# Patient Record
Sex: Female | Born: 1955 | Race: White | Hispanic: No | Smoking: Current every day smoker
Health system: Southern US, Community
[De-identification: ages and names within clinical notes are randomized; demographics above are authoritative.]

## PROBLEM LIST (undated history)

## (undated) DIAGNOSIS — I8390 Asymptomatic varicose veins of unspecified lower extremity: Secondary | ICD-10-CM

## (undated) DIAGNOSIS — E669 Obesity, unspecified: Secondary | ICD-10-CM

## (undated) DIAGNOSIS — M1990 Unspecified osteoarthritis, unspecified site: Secondary | ICD-10-CM

## (undated) DIAGNOSIS — Z1211 Encounter for screening for malignant neoplasm of colon: Secondary | ICD-10-CM

## (undated) DIAGNOSIS — Z87891 Personal history of nicotine dependence: Secondary | ICD-10-CM

## (undated) DIAGNOSIS — I83893 Varicose veins of bilateral lower extremities with other complications: Secondary | ICD-10-CM

## (undated) HISTORY — DX: Obesity, unspecified: E66.9

## (undated) HISTORY — DX: Personal history of nicotine dependence: Z87.891

## (undated) HISTORY — DX: Asymptomatic varicose veins of unspecified lower extremity: I83.90

## (undated) HISTORY — DX: Unspecified osteoarthritis, unspecified site: M19.90

## (undated) HISTORY — PX: OTHER SURGICAL HISTORY: SHX169

## (undated) HISTORY — DX: Encounter for screening for malignant neoplasm of colon: Z12.11

## (undated) HISTORY — DX: Varicose veins of bilateral lower extremities with other complications: I83.893

---

## 1991-02-25 HISTORY — PX: TUBAL LIGATION: SHX77

## 2006-02-24 HISTORY — PX: COLONOSCOPY: SHX174

## 2006-03-02 ENCOUNTER — Ambulatory Visit: Payer: Self-pay | Admitting: Gastroenterology

## 2006-08-04 ENCOUNTER — Ambulatory Visit: Payer: Self-pay | Admitting: General Surgery

## 2010-03-26 ENCOUNTER — Ambulatory Visit: Payer: Self-pay | Admitting: Family Medicine

## 2011-09-25 ENCOUNTER — Ambulatory Visit: Payer: Self-pay

## 2011-11-29 ENCOUNTER — Ambulatory Visit: Payer: Self-pay | Admitting: Internal Medicine

## 2012-01-29 ENCOUNTER — Ambulatory Visit: Payer: Self-pay

## 2012-03-18 ENCOUNTER — Ambulatory Visit: Payer: Self-pay

## 2012-08-11 ENCOUNTER — Encounter: Payer: Self-pay | Admitting: *Deleted

## 2012-12-29 ENCOUNTER — Telehealth: Payer: Self-pay

## 2012-12-29 NOTE — Telephone Encounter (Signed)
Patient will call us for any future problems.

## 2012-12-29 NOTE — Telephone Encounter (Signed)
Message copied by Sinda Du on Wed Dec 29, 2012  3:55 PM ------      Message from: Kieth Brightly      Created: Wed Dec 29, 2012  2:12 PM      Regarding: RE: recalls       Make sure this documented somewhere. It is ok fro pt to return as needed.      ----- Message -----         From: Sinda Du, LPN         Sent: 12/29/2012  11:10 AM           To: Kieth Brightly, MD      Subject: recalls                                                  Patient called to cancel her 1 year follow up for varicose veins. She said that she was doing very well and that she will call back if any problems arise. She did not feel the need to follow up this year. She reports no bulging veins or leg discomfort.       ------

## 2013-01-03 ENCOUNTER — Ambulatory Visit: Payer: Self-pay | Admitting: General Surgery

## 2013-06-28 ENCOUNTER — Ambulatory Visit: Payer: Self-pay

## 2014-01-18 ENCOUNTER — Ambulatory Visit: Payer: Self-pay | Admitting: General Surgery

## 2014-08-07 DIAGNOSIS — M8580 Other specified disorders of bone density and structure, unspecified site: Secondary | ICD-10-CM | POA: Insufficient documentation

## 2014-08-07 DIAGNOSIS — F411 Generalized anxiety disorder: Secondary | ICD-10-CM | POA: Insufficient documentation

## 2014-08-08 ENCOUNTER — Encounter: Payer: Self-pay | Admitting: Unknown Physician Specialty

## 2014-08-08 ENCOUNTER — Other Ambulatory Visit: Payer: Self-pay | Admitting: Unknown Physician Specialty

## 2014-08-08 ENCOUNTER — Ambulatory Visit (INDEPENDENT_AMBULATORY_CARE_PROVIDER_SITE_OTHER): Payer: BLUE CROSS/BLUE SHIELD | Admitting: Unknown Physician Specialty

## 2014-08-08 VITALS — BP 117/80 | HR 79 | Temp 98.2°F | Ht 63.7 in | Wt 170.6 lb

## 2014-08-08 DIAGNOSIS — M8580 Other specified disorders of bone density and structure, unspecified site: Secondary | ICD-10-CM | POA: Diagnosis not present

## 2014-08-08 DIAGNOSIS — Z0000 Encounter for general adult medical examination without abnormal findings: Secondary | ICD-10-CM | POA: Diagnosis not present

## 2014-08-08 DIAGNOSIS — Z23 Encounter for immunization: Secondary | ICD-10-CM | POA: Diagnosis not present

## 2014-08-08 NOTE — Progress Notes (Signed)
BP 117/80 mmHg  Pulse 79  Temp(Src) 98.2 F (36.8 C)  Ht 5' 3.7" (1.618 m)  Wt 170 lb 9.6 oz (77.384 kg)  BMI 29.56 kg/m2  SpO2 98%  LMP  (LMP Unknown)   Subjective:    Patient ID: Brittney Tucker, female    DOB: 01-Apr-1955, 59 y.o.   MRN: [id number]  HPI: Brittney Tucker is a 59 y.o. female  Chief Complaint  Patient presents with  . Annual Exam    Relevant past medical, surgical, family and social history reviewed and updated as indicated. Interim medical history since our last visit reviewed. Allergies and medications reviewed and updated.  Review of Systems  Constitutional: Negative.   HENT: Negative.   Eyes: Negative.   Respiratory: Negative.   Cardiovascular: Negative.   Gastrointestinal: Negative.   Endocrine: Negative.   Genitourinary: Negative.   Musculoskeletal: Negative.   Skin: Negative.   Allergic/Immunologic: Negative.   Neurological: Negative.   Hematological: Negative.   Psychiatric/Behavioral: Negative.     Per HPI unless specifically indicated above     Objective:    BP 117/80 mmHg  Pulse 79  Temp(Src) 98.2 F (36.8 C)  Ht 5' 3.7" (1.618 m)  Wt 170 lb 9.6 oz (77.384 kg)  BMI 29.56 kg/m2  SpO2 98%  LMP  (LMP Unknown)  Wt Readings from Last 3 Encounters:  08/08/14 170 lb 9.6 oz (77.384 kg)    Physical Exam  Constitutional: She is oriented to person, place, and time. She appears well-developed and well-nourished.  HENT:  Head: Normocephalic and atraumatic.  Eyes: Pupils are equal, round, and reactive to light. Right eye exhibits no discharge. Left eye exhibits no discharge. No scleral icterus.  Neck: Normal range of motion. Neck supple. Carotid bruit is not present. No thyromegaly present.  Cardiovascular: Normal rate, regular rhythm and normal heart sounds.  Exam reveals no gallop and no friction rub.   No murmur heard. Pulmonary/Chest: Effort normal and breath sounds normal. No respiratory distress. She has no wheezes. She has no  rales.  Abdominal: Soft. Bowel sounds are normal. There is no tenderness. There is no rebound.  Genitourinary: Vagina normal and uterus normal. No breast swelling, tenderness or discharge. Pelvic exam was performed with patient prone. There is no rash, tenderness, lesion or injury on the right labia. There is no rash, tenderness, lesion or injury on the left labia. Cervix exhibits no motion tenderness, no discharge and no friability. Right adnexum displays no mass, no tenderness and no fullness. Left adnexum displays no mass, no tenderness and no fullness.  Musculoskeletal: Normal range of motion.  Lymphadenopathy:    She has no cervical adenopathy.  Neurological: She is alert and oriented to person, place, and time.  Skin: Skin is warm, dry and intact. No rash noted.  Psychiatric: She has a normal mood and affect. Her speech is normal and behavior is normal. Judgment and thought content normal. Cognition and memory are normal.    No results found for this or any previous visit.    Assessment & Plan:   Problem List Items Addressed This Visit      Musculoskeletal and Integument   Osteopenia    On medication.  Has been on for about 3 years.  Will continue      Relevant Orders   Vit D  25 hydroxy (rtn osteoporosis monitoring)    Other Visit Diagnoses    Routine general medical examination at a health care facility    -  Primary    Relevant Orders    CBC    Comprehensive metabolic panel    Lipid Panel w/o Chol/HDL Ratio    Pap liquid-based and HPV (high risk)    TSH    Varicella-zoster vaccine subcutaneous    Vit D  25 hydroxy (rtn osteoporosis monitoring)    Mammogram Digital Screening        Follow up plan: Return in 1 year (on 08/08/2015).

## 2014-08-08 NOTE — Patient Instructions (Signed)
Health Maintenance Adopting a healthy lifestyle and getting preventive care can go a long way to promote health and wellness. Talk with your health care provider about what schedule of regular examinations is right for you. This is a good chance for you to check in with your provider about disease prevention and staying healthy. In between checkups, there are plenty of things you can do on your own. Experts have done a lot of research about which lifestyle changes and preventive measures are most likely to keep you healthy. Ask your health care provider for more information. WEIGHT AND DIET  Eat a healthy diet  Be sure to include plenty of vegetables, fruits, low-fat dairy products, and lean protein.  Do not eat a lot of foods high in solid fats, added sugars, or salt.  Get regular exercise. This is one of the most important things you can do for your health.  Most adults should exercise for at least 150 minutes each week. The exercise should increase your heart rate and make you sweat (moderate-intensity exercise).  Most adults should also do strengthening exercises at least twice a week. This is in addition to the moderate-intensity exercise.  Maintain a healthy weight  Body mass index (BMI) is a measurement that can be used to identify possible weight problems. It estimates body fat based on height and weight. Your health care provider can help determine your BMI and help you achieve or maintain a healthy weight.  For females 61 years of age and older:   A BMI below 18.5 is considered underweight.  A BMI of 18.5 to 24.9 is normal.  A BMI of 25 to 29.9 is considered overweight.  A BMI of 30 and above is considered obese.  Watch levels of cholesterol and blood lipids  You should start having your blood tested for lipids and cholesterol at 61 years of age, then have this test every 5 years.  You may need to have your cholesterol levels checked more often if:  Your lipid or  cholesterol levels are high.  You are older than 61 years of age.  You are at high risk for heart disease.  CANCER SCREENING   Lung Cancer  Lung cancer screening is recommended for adults 77-19 years old who are at high risk for lung cancer because of a history of smoking.  A yearly low-dose CT scan of the lungs is recommended for people who:  Currently smoke.  Have quit within the past 15 years.  Have at least a 30-pack-year history of smoking. A pack year is smoking an average of one pack of cigarettes a day for 1 year.  Yearly screening should continue until it has been 15 years since you quit.  Yearly screening should stop if you develop a health problem that would prevent you from having lung cancer treatment.  Breast Cancer  Practice breast self-awareness. This means understanding how your breasts normally appear and feel.  It also means doing regular breast self-exams. Let your health care provider know about any changes, no matter how small.  If you are in your 20s or 30s, you should have a clinical breast exam (CBE) by a health care provider every 1-3 years as part of a regular health exam.  If you are 15 or older, have a CBE every year. Also consider having a breast X-ray (mammogram) every year.  If you have a family history of breast cancer, talk to your health care provider about genetic screening.  If you are  at high risk for breast cancer, talk to your health care provider about having an MRI and a mammogram every year.  Breast cancer gene (BRCA) assessment is recommended for women who have family members with BRCA-related cancers. BRCA-related cancers include:  Breast.  Ovarian.  Tubal.  Peritoneal cancers.  Results of the assessment will determine the need for genetic counseling and BRCA1 and BRCA2 testing. Cervical Cancer Routine pelvic examinations to screen for cervical cancer are no longer recommended for nonpregnant women who are considered low  risk for cancer of the pelvic organs (ovaries, uterus, and vagina) and who do not have symptoms. A pelvic examination may be necessary if you have symptoms including those associated with pelvic infections. Ask your health care provider if a screening pelvic exam is right for you.   The Pap test is the screening test for cervical cancer for women who are considered at risk.  If you had a hysterectomy for a problem that was not cancer or a condition that could lead to cancer, then you no longer need Pap tests.  If you are older than 65 years, and you have had normal Pap tests for the past 10 years, you no longer need to have Pap tests.  If you have had past treatment for cervical cancer or a condition that could lead to cancer, you need Pap tests and screening for cancer for at least 20 years after your treatment.  If you no longer get a Pap test, assess your risk factors if they change (such as having a new sexual partner). This can affect whether you should start being screened again.  Some women have medical problems that increase their chance of getting cervical cancer. If this is the case for you, your health care provider may recommend more frequent screening and Pap tests.  The human papillomavirus (HPV) test is another test that may be used for cervical cancer screening. The HPV test looks for the virus that can cause cell changes in the cervix. The cells collected during the Pap test can be tested for HPV.  The HPV test can be used to screen women 30 years of age and older. Getting tested for HPV can extend the interval between normal Pap tests from three to five years.  An HPV test also should be used to screen women of any age who have unclear Pap test results.  After 61 years of age, women should have HPV testing as often as Pap tests.  Colorectal Cancer  This type of cancer can be detected and often prevented.  Routine colorectal cancer screening usually begins at 61 years of  age and continues through 61 years of age.  Your health care provider may recommend screening at an earlier age if you have risk factors for colon cancer.  Your health care provider may also recommend using home test kits to check for hidden blood in the stool.  A small camera at the end of a tube can be used to examine your colon directly (sigmoidoscopy or colonoscopy). This is done to check for the earliest forms of colorectal cancer.  Routine screening usually begins at age 50.  Direct examination of the colon should be repeated every 5-10 years through 61 years of age. However, you may need to be screened more often if early forms of precancerous polyps or small growths are found. Skin Cancer  Check your skin from head to toe regularly.  Tell your health care provider about any new moles or changes in   moles, especially if there is a change in a mole's shape or color.  Also tell your health care provider if you have a mole that is larger than the size of a pencil eraser.  Always use sunscreen. Apply sunscreen liberally and repeatedly throughout the day.  Protect yourself by wearing long sleeves, pants, a wide-brimmed hat, and sunglasses whenever you are outside. HEART DISEASE, DIABETES, AND HIGH BLOOD PRESSURE   Have your blood pressure checked at least every 1-2 years. High blood pressure causes heart disease and increases the risk of stroke.  If you are between 75 years and 42 years old, ask your health care provider if you should take aspirin to prevent strokes.  Have regular diabetes screenings. This involves taking a blood sample to check your fasting blood sugar level.  If you are at a normal weight and have a low risk for diabetes, have this test once every three years after 61 years of age.  If you are overweight and have a high risk for diabetes, consider being tested at a younger age or more often. PREVENTING INFECTION  Hepatitis B  If you have a higher risk for  hepatitis B, you should be screened for this virus. You are considered at high risk for hepatitis B if:  You were born in a country where hepatitis B is common. Ask your health care provider which countries are considered high risk.  Your parents were born in a high-risk country, and you have not been immunized against hepatitis B (hepatitis B vaccine).  You have HIV or AIDS.  You use needles to inject street drugs.  You live with someone who has hepatitis B.  You have had sex with someone who has hepatitis B.  You get hemodialysis treatment.  You take certain medicines for conditions, including cancer, organ transplantation, and autoimmune conditions. Hepatitis C  Blood testing is recommended for:  Everyone born from [birth year] through 1967.  Anyone with known risk factors for hepatitis C. Sexually transmitted infections (STIs)  You should be screened for sexually transmitted infections (STIs) including gonorrhea and chlamydia if:  You are sexually active and are younger than 61 years of age.  You are older than 61 years of age and your health care provider tells you that you are at risk for this type of infection.  Your sexual activity has changed since you were last screened and you are at an increased risk for chlamydia or gonorrhea. Ask your health care provider if you are at risk.  If you do not have HIV, but are at risk, it may be recommended that you take a prescription medicine daily to prevent HIV infection. This is called pre-exposure prophylaxis (PrEP). You are considered at risk if:  You are sexually active and do not regularly use condoms or know the HIV status of your partner(s).  You take drugs by injection.  You are sexually active with a partner who has HIV. Talk with your health care provider about whether you are at high risk of being infected with HIV. If you choose to begin PrEP, you should first be tested for HIV. You should then be tested every 3 months for  as long as you are taking PrEP.  PREGNANCY   If you are premenopausal and you may become pregnant, ask your health care provider about preconception counseling.  If you may become pregnant, take 400 to 800 micrograms (mcg) of folic acid every day.  If you want to prevent pregnancy, talk to your  health care provider about birth control (contraception). OSTEOPOROSIS AND MENOPAUSE   Osteoporosis is a disease in which the bones lose minerals and strength with aging. This can result in serious bone fractures. Your risk for osteoporosis can be identified using a bone density scan.  If you are 22 years of age or older, or if you are at risk for osteoporosis and fractures, ask your health care provider if you should be screened.  Ask your health care provider whether you should take a calcium or vitamin D supplement to lower your risk for osteoporosis.  Menopause may have certain physical symptoms and risks.  Hormone replacement therapy may reduce some of these symptoms and risks. Talk to your health care provider about whether hormone replacement therapy is right for you.  HOME CARE INSTRUCTIONS   Schedule regular health, dental, and eye exams.  Stay current with your immunizations.   Do not use any tobacco products including cigarettes, chewing tobacco, or electronic cigarettes.  If you are pregnant, do not drink alcohol.  If you are breastfeeding, limit how much and how often you drink alcohol.  Limit alcohol intake to no more than 1 drink per day for nonpregnant women. One drink equals 12 ounces of beer, 5 ounces of wine, or 1 ounces of hard liquor.  Do not use street drugs.  Do not share needles.  Ask your health care provider for help if you need support or information about quitting drugs.  Tell your health care provider if you often feel depressed.  Tell your health care provider if you have ever been abused or do not feel safe at home. Document Released: 08/26/2010  Document Revised: 06/27/2013 Document Reviewed: 01/12/2013 [facility] Patient Information 2015 [place], [place]. This information is not intended to replace advice given to you by your health care provider. Make sure you discuss any questions you have with your health care provider.     Why follow it? Research shows. . Those who follow the Mediterranean diet have a reduced risk of heart disease  . The diet is associated with a reduced incidence of Parkinson's and Alzheimer's diseases . People following the diet may have longer life expectancies and lower rates of chronic diseases  . The Dietary Guidelines for Americans recommends the Mediterranean diet as an eating plan to promote health and prevent disease  What Is the Mediterranean Diet?  . Healthy eating plan based on typical foods and recipes of Mediterranean-style cooking . The diet is primarily a plant based diet; these foods should make up a majority of meals   Starches - Plant based foods should make up a majority of meals - They are an important sources of vitamins, minerals, energy, antioxidants, and fiber - Choose whole grains, foods high in fiber and minimally processed items  - Typical grain sources include wheat, oats, barley, corn, brown rice, bulgar, farro, millet, polenta, couscous  - Various types of beans include chickpeas, lentils, fava beans, black beans, white beans   Fruits  Veggies - Large quantities of antioxidant rich fruits & veggies; 6 or more servings  - Vegetables can be eaten raw or lightly drizzled with oil and cooked  - Vegetables common to the traditional Mediterranean Diet include: artichokes, arugula, beets, broccoli, brussel sprouts, cabbage, carrots, celery, collard greens, cucumbers, eggplant, kale, leeks, lemons, lettuce, mushrooms, okra, onions, peas, peppers, potatoes, pumpkin, radishes, rutabaga, shallots, spinach, sweet potatoes, turnips, zucchini - Fruits common to the Mediterranean Diet include: apples,  apricots, avocados, cherries, clementines, dates, figs, grapefruits,  grapes, melons, nectarines, oranges, peaches, pears, pomegranates, strawberries, tangerines  Fats - Replace butter and margarine with healthy oils, such as olive oil, canola oil, and tahini  - Limit nuts to no more than a handful a day  - Nuts include walnuts, almonds, pecans, pistachios, pine nuts  - Limit or avoid candied, honey roasted or heavily salted nuts - Olives are central to the Mediterranean diet - can be eaten whole or used in a variety of dishes   Meats Protein - Limiting red meat: no more than a few times a month - When eating red meat: choose lean cuts and keep the portion to the size of deck of cards - Eggs: approx. 0 to 4 times a week  - Fish and lean poultry: at least 2 a week  - Healthy protein sources include, chicken, [place], lean beef, lamb - Increase intake of seafood such as tuna, salmon, trout, mackerel, shrimp, scallops - Avoid or limit high fat processed meats such as sausage and bacon  Dairy - Include moderate amounts of low fat dairy products  - Focus on healthy dairy such as fat free yogurt, skim milk, low or reduced fat cheese - Limit dairy products higher in fat such as whole or 2% milk, cheese, ice cream  Alcohol - Moderate amounts of red wine is ok  - No more than 5 oz daily for women (all ages) and men older than age 65  - No more than 10 oz of wine daily for men younger than 21  Other - Limit sweets and other desserts  - Use herbs and spices instead of salt to flavor foods  - Herbs and spices common to the traditional Mediterranean Diet include: basil, bay leaves, chives, cloves, cumin, fennel, garlic, lavender, marjoram, mint, oregano, parsley, pepper, rosemary, sage, savory, sumac, tarragon, thyme   It's not just a diet, it's a lifestyle:  . The Mediterranean diet includes lifestyle factors typical of those in the region  . Foods, drinks and meals are best eaten with others and  savored . Daily physical activity is important for overall good health . This could be strenuous exercise like running and aerobics . This could also be more leisurely activities such as walking, housework, yard-work, or taking the stairs . Moderation is the key; a balanced and healthy diet accommodates most foods and drinks . Consider portion sizes and frequency of consumption of certain foods   Meal Ideas & Options:  . Breakfast:  o Whole wheat toast or whole wheat English muffins with peanut butter & hard boiled egg o Steel cut oats topped with apples & cinnamon and skim milk  o Fresh fruit: banana, strawberries, melon, berries, peaches  o Smoothies: strawberries, bananas, greek yogurt, peanut butter o Low fat greek yogurt with blueberries and granola  o Egg white omelet with spinach and mushrooms o Breakfast couscous: whole wheat couscous, apricots, skim milk, cranberries  . Sandwiches:  o Hummus and grilled vegetables (peppers, zucchini, squash) on whole wheat bread   o Grilled chicken on whole wheat pita with lettuce, tomatoes, cucumbers or tzatziki  o Tuna salad on whole wheat bread: tuna salad made with greek yogurt, olives, red peppers, capers, green onions o Garlic rosemary lamb pita: lamb sauted with garlic, rosemary, salt & pepper; add lettuce, cucumber, greek yogurt to pita - flavor with lemon juice and black pepper  . Seafood:  o Mediterranean grilled salmon, seasoned with garlic, basil, parsley, lemon juice and black pepper o Shrimp, lemon, and spinach  whole-grain pasta salad made with low fat greek yogurt  o Seared scallops with lemon orzo  o Seared tuna steaks seasoned salt, pepper, coriander topped with tomato mixture of olives, tomatoes, olive oil, minced garlic, parsley, green onions and cappers  . Meats:  o Herbed greek chicken salad with kalamata olives, cucumber, feta  o Red bell peppers stuffed with spinach, bulgur, lean ground beef (or lentils) & topped with feta    o Kebabs: skewers of chicken, tomatoes, onions, zucchini, squash  o [place] burgers: made with red onions, mint, dill, lemon juice, feta cheese topped with roasted red peppers . Vegetarian o Cucumber salad: cucumbers, artichoke hearts, celery, red onion, feta cheese, tossed in olive oil & lemon juice  o Hummus and whole grain pita points with a greek salad (lettuce, tomato, feta, olives, cucumbers, red onion) o Lentil soup with celery, carrots made with vegetable broth, garlic, salt and pepper  o Tabouli salad: parsley, bulgur, mint, scallions, cucumbers, tomato, radishes, lemon juice, olive oil, salt and pepper.

## 2014-08-08 NOTE — Assessment & Plan Note (Signed)
On medication.  Has been on for about 3 years.  Will continue

## 2014-08-09 ENCOUNTER — Other Ambulatory Visit: Payer: Self-pay | Admitting: Unknown Physician Specialty

## 2014-08-09 DIAGNOSIS — R7989 Other specified abnormal findings of blood chemistry: Secondary | ICD-10-CM

## 2014-08-09 LAB — CBC
HEMATOCRIT: 39.9 % (ref 34.0–46.6)
Hemoglobin: 13 g/dL (ref 11.1–15.9)
MCH: 27.5 pg (ref 26.6–33.0)
MCHC: 32.6 g/dL (ref 31.5–35.7)
MCV: 84 fL (ref 79–97)
PLATELETS: 259 10*3/uL (ref 150–379)
RBC: 4.73 x10E6/uL (ref 3.77–5.28)
RDW: 14.1 % (ref 12.3–15.4)
WBC: 8.5 10*3/uL (ref 3.4–10.8)

## 2014-08-09 LAB — COMPREHENSIVE METABOLIC PANEL
A/G RATIO: 2.2 (ref 1.1–2.5)
ALT: 17 IU/L (ref 0–32)
AST: 19 IU/L (ref 0–40)
Albumin: 4.4 g/dL (ref 3.6–4.8)
Alkaline Phosphatase: 104 IU/L (ref 39–117)
BUN / CREAT RATIO: 18 (ref 11–26)
BUN: 11 mg/dL (ref 8–27)
Bilirubin Total: 0.3 mg/dL (ref 0.0–1.2)
CO2: 21 mmol/L (ref 18–29)
Calcium: 9.2 mg/dL (ref 8.7–10.3)
Chloride: 102 mmol/L (ref 97–108)
Creatinine, Ser: 0.61 mg/dL (ref 0.57–1.00)
GFR calc Af Amer: 114 mL/min/{1.73_m2} (ref >59)
GFR calc non Af Amer: 99 mL/min/{1.73_m2} (ref >59)
Globulin, Total: 2 g/dL (ref 1.5–4.5)
Glucose: 74 mg/dL (ref 65–99)
Potassium: 4.4 mmol/L (ref 3.5–5.2)
SODIUM: 142 mmol/L (ref 134–144)
Total Protein: 6.4 g/dL (ref 6.0–8.5)

## 2014-08-09 LAB — TSH: TSH: 4.75 u[IU]/mL — AB (ref 0.450–4.500)

## 2014-08-09 LAB — VITAMIN D 25 HYDROXY (VIT D DEFICIENCY, FRACTURES): VIT D 25 HYDROXY: 42.6 ng/mL (ref 30.0–100.0)

## 2014-08-09 LAB — LIPID PANEL W/O CHOL/HDL RATIO
Cholesterol, Total: 200 mg/dL — ABNORMAL HIGH (ref 100–199)
HDL: 53 mg/dL (ref >39)
LDL Calculated: 129 mg/dL — ABNORMAL HIGH (ref 0–99)
Triglycerides: 90 mg/dL (ref 0–149)
VLDL Cholesterol Cal: 18 mg/dL (ref 5–40)

## 2014-08-10 ENCOUNTER — Telehealth: Payer: Self-pay

## 2014-08-10 LAB — PAP LB AND HPV HIGH-RISK
HPV, high-risk: NEGATIVE
PAP SMEAR COMMENT: 0

## 2014-08-10 NOTE — Telephone Encounter (Signed)
Patient called back and I gave her the results on her pap. Patient stated that she was at the dentist this morning and the dentist asked her about her thyroid. Patient is now worried about her thyroid and does not want to wait 3 months to have thyroid rechecked like Brittney Tucker suggested.

## 2014-08-10 NOTE — Telephone Encounter (Signed)
Called and left patient a message to call back so I can give her results.

## 2014-08-10 NOTE — Telephone Encounter (Signed)
-----   Message from Cheryl Wicker, NP sent at 08/10/2014  3:16 PM EDT ----- Call tell pap normal 

## 2014-08-10 NOTE — Telephone Encounter (Signed)
-----   Message from Gabriel Cirri, NP sent at 08/10/2014  3:16 PM EDT ----- Call tell pap normal

## 2014-08-10 NOTE — Telephone Encounter (Signed)
Let her know we can recheck in a month, but let her know it is nothing to worry about.  The thyroid numbers are only slightly different than normal.  Recommendations are that we don't treat based on those numbers and we just monitor.  Was there something in particular her dentist was concerned about?

## 2014-08-11 NOTE — Telephone Encounter (Signed)
Tried to call patient to find out what exactly the dentist was concerned about. Asked for her to return the call.

## 2014-08-11 NOTE — Telephone Encounter (Signed)
Called and spoke to patient. Patient stated that her dentist just asked her if she had ever had her thyroid checked so now patient is concerned. Patient stated she would make a appointment in a month to have thyroid rechecked.

## 2014-08-11 NOTE — Telephone Encounter (Signed)
Pt.notified

## 2014-08-11 NOTE — Telephone Encounter (Signed)
-----   Message from Cheryl Wicker, NP sent at 08/10/2014  3:16 PM EDT ----- Call tell pap normal 

## 2015-03-19 ENCOUNTER — Other Ambulatory Visit: Payer: Self-pay | Admitting: Unknown Physician Specialty

## 2015-06-25 ENCOUNTER — Ambulatory Visit (INDEPENDENT_AMBULATORY_CARE_PROVIDER_SITE_OTHER): Payer: BLUE CROSS/BLUE SHIELD | Admitting: Unknown Physician Specialty

## 2015-06-25 ENCOUNTER — Encounter: Payer: Self-pay | Admitting: Unknown Physician Specialty

## 2015-06-25 VITALS — BP 132/83 | HR 86 | Temp 98.4°F | Ht 63.6 in | Wt 172.0 lb

## 2015-06-25 DIAGNOSIS — L247 Irritant contact dermatitis due to plants, except food: Secondary | ICD-10-CM

## 2015-06-25 MED ORDER — PREDNISONE 10 MG (21) PO TBPK: ORAL_TABLET | ORAL | Status: AC

## 2015-06-25 MED ORDER — BETAMETHASONE DIPROPIONATE AUG 0.05 % EX CREA: TOPICAL_CREAM | Freq: Two times a day (BID) | CUTANEOUS | Status: AC

## 2015-06-25 NOTE — Progress Notes (Signed)
BP 132/83 mmHg  Pulse 86  Temp(Src) 98.4 F (36.9 C)  Ht 5' 3.6" (1.615 m)  Wt 172 lb (78.019 kg)  BMI 29.91 kg/m2  SpO2 97%  LMP  (LMP Unknown)   Subjective:    Patient ID: Brittney Tucker, female    DOB: 1956/01/24, 60 y.o.   MRN: [id number]  HPI: Brittney Tucker is a 60 y.o. female  Chief Complaint  Patient presents with  . Rash    pt states a rash came up on her hands Saturday and is spreading. States she had blisters and thinks it may have come from doing yard work on Thursday   Pt states she has a rash on her hands and face.  States it is spreading.    Relevant past medical, surgical, family and social history reviewed and updated as indicated. Interim medical history since our last visit reviewed. Allergies and medications reviewed and updated.  Review of Systems  Per HPI unless specifically indicated above     Objective:    BP 132/83 mmHg  Pulse 86  Temp(Src) 98.4 F (36.9 C)  Ht 5' 3.6" (1.615 m)  Wt 172 lb (78.019 kg)  BMI 29.91 kg/m2  SpO2 97%  LMP  (LMP Unknown)  Wt Readings from Last 3 Encounters:  06/25/15 172 lb (78.019 kg)  08/08/14 170 lb 9.6 oz (77.384 kg)    Physical Exam  Constitutional: She is oriented to person, place, and time. She appears well-developed and well-nourished. No distress.  HENT:  Head: Normocephalic and atraumatic.  Eyes: Conjunctivae and lids are normal. Right eye exhibits no discharge. Left eye exhibits no discharge. No scleral icterus.  Cardiovascular: Normal rate.   Pulmonary/Chest: Effort normal.  Abdominal: Normal appearance. There is no splenomegaly or hepatomegaly.  Musculoskeletal: Normal range of motion.  Neurological: She is alert and oriented to person, place, and time.  Skin: Skin is warm, dry and intact. Rash noted. No pallor.  Erythemetous and vesicular patches bilateral hands.  Patches also on face.    Psychiatric: She has a normal mood and affect. Her behavior is normal. Judgment and thought content  normal.    Results for orders placed or performed in visit on 08/08/14  CBC  Result Value Ref Range   WBC 8.5 3.4 - 10.8 x10E3/uL   RBC 4.73 3.77 - 5.28 x10E6/uL   Hemoglobin 13.0 11.1 - 15.9 g/dL   Hematocrit 40.9 81.1 - 46.6 %   MCV 84 79 - 97 fL   MCH 27.5 26.6 - 33.0 pg   MCHC 32.6 31.5 - 35.7 g/dL   RDW 91.4 78.2 - 95.6 %   Platelets 259 150 - 379 x10E3/uL  Comprehensive metabolic panel  Result Value Ref Range   Glucose 74 65 - 99 mg/dL   BUN 11 8 - 27 mg/dL   Creatinine, Ser 2.13 0.57 - 1.00 mg/dL   GFR calc non Af Amer 99 >59 mL/min/1.73   GFR calc Af Amer 114 >59 mL/min/1.73   BUN/Creatinine Ratio 18 11 - 26   Sodium 142 134 - 144 mmol/L   Potassium 4.4 3.5 - 5.2 mmol/L   Chloride 102 97 - 108 mmol/L   CO2 21 18 - 29 mmol/L   Calcium 9.2 8.7 - 10.3 mg/dL   Total Protein 6.4 6.0 - 8.5 g/dL   Albumin 4.4 3.6 - 4.8 g/dL   Globulin, Total 2.0 1.5 - 4.5 g/dL   Albumin/Globulin Ratio 2.2 1.1 - 2.5   Bilirubin Total 0.3 0.0 -  1.2 mg/dL   Alkaline Phosphatase 104 39 - 117 IU/L   AST 19 0 - 40 IU/L   ALT 17 0 - 32 IU/L  Lipid Panel w/o Chol/HDL Ratio  Result Value Ref Range   Cholesterol, Total 200 (H) 100 - 199 mg/dL   Triglycerides 90 0 - 149 mg/dL   HDL 53 >78>39 mg/dL   VLDL Cholesterol Cal 18 5 - 40 mg/dL   LDL Calculated 295129 (H) 0 - 99 mg/dL  TSH  Result Value Ref Range   TSH 4.750 (H) 0.450 - 4.500 uIU/mL  Vit D  25 hydroxy (rtn osteoporosis monitoring)  Result Value Ref Range   Vit D, 25-Hydroxy 42.6 30.0 - 100.0 ng/mL  Pap liquid-based and HPV (high risk)  Result Value Ref Range   DIAGNOSIS: Comment    Specimen adequacy: Comment    CLINICIAN PROVIDED ICD10: Comment    Performed by: Comment    PAP SMEAR COMMENT .    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   Prednosone cream BID        Follow up plan: Return if symptoms worsen or fail to improve.

## 2015-08-10 ENCOUNTER — Encounter: Payer: BLUE CROSS/BLUE SHIELD | Admitting: Unknown Physician Specialty

## 2016-02-08 ENCOUNTER — Other Ambulatory Visit: Payer: Self-pay | Admitting: Unknown Physician Specialty

## 2016-02-08 DIAGNOSIS — Z1231 Encounter for screening mammogram for malignant neoplasm of breast: Secondary | ICD-10-CM

## 2016-03-14 ENCOUNTER — Ambulatory Visit: Payer: Self-pay

## 2016-04-07 ENCOUNTER — Ambulatory Visit
Admission: RE | Admit: 2016-04-07 | Discharge: 2016-04-07 | Disposition: A | Discharge status: Home / Self Care | Payer: BLUE CROSS/BLUE SHIELD | Source: Ambulatory Visit | Attending: Unknown Physician Specialty | Admitting: Unknown Physician Specialty

## 2016-04-07 DIAGNOSIS — Z1231 Encounter for screening mammogram for malignant neoplasm of breast: Secondary | ICD-10-CM | POA: Diagnosis not present

## 2017-08-24 ENCOUNTER — Other Ambulatory Visit: Payer: Self-pay | Admitting: Family Medicine

## 2017-08-24 ENCOUNTER — Other Ambulatory Visit: Payer: Self-pay | Admitting: Unknown Physician Specialty

## 2017-08-24 DIAGNOSIS — Z1231 Encounter for screening mammogram for malignant neoplasm of breast: Secondary | ICD-10-CM

## 2017-09-03 ENCOUNTER — Encounter (INDEPENDENT_AMBULATORY_CARE_PROVIDER_SITE_OTHER): Payer: Self-pay

## 2017-09-03 ENCOUNTER — Ambulatory Visit
Admission: RE | Admit: 2017-09-03 | Discharge: 2017-09-03 | Disposition: A | Discharge status: Home / Self Care | Payer: BLUE CROSS/BLUE SHIELD | Source: Ambulatory Visit | Attending: Family Medicine | Admitting: Family Medicine

## 2017-09-03 DIAGNOSIS — Z1231 Encounter for screening mammogram for malignant neoplasm of breast: Secondary | ICD-10-CM | POA: Diagnosis not present

## 2019-06-25 ENCOUNTER — Emergency Department: Payer: Medicare Other

## 2019-06-25 ENCOUNTER — Other Ambulatory Visit: Payer: Self-pay

## 2019-06-25 ENCOUNTER — Emergency Department
Admission: EM | Admit: 2019-06-25 | Discharge: 2019-06-25 | Disposition: A | Discharge status: Home / Self Care | Payer: Medicare Other | Attending: Emergency Medicine | Admitting: Emergency Medicine

## 2019-06-25 DIAGNOSIS — W010XXA Fall on same level from slipping, tripping and stumbling without subsequent striking against object, initial encounter: Secondary | ICD-10-CM | POA: Insufficient documentation

## 2019-06-25 DIAGNOSIS — S92324A Nondisplaced fracture of second metatarsal bone, right foot, initial encounter for closed fracture: Secondary | ICD-10-CM

## 2019-06-25 DIAGNOSIS — W19XXXA Unspecified fall, initial encounter: Secondary | ICD-10-CM

## 2019-06-25 DIAGNOSIS — S299XXA Unspecified injury of thorax, initial encounter: Secondary | ICD-10-CM | POA: Diagnosis present

## 2019-06-25 DIAGNOSIS — S2241XA Multiple fractures of ribs, right side, initial encounter for closed fracture: Secondary | ICD-10-CM | POA: Diagnosis not present

## 2019-06-25 DIAGNOSIS — F17210 Nicotine dependence, cigarettes, uncomplicated: Secondary | ICD-10-CM | POA: Diagnosis not present

## 2019-06-25 DIAGNOSIS — Y92008 Other place in unspecified non-institutional (private) residence as the place of occurrence of the external cause: Secondary | ICD-10-CM | POA: Insufficient documentation

## 2019-06-25 DIAGNOSIS — Y999 Unspecified external cause status: Secondary | ICD-10-CM | POA: Insufficient documentation

## 2019-06-25 DIAGNOSIS — Z91040 Latex allergy status: Secondary | ICD-10-CM | POA: Insufficient documentation

## 2019-06-25 DIAGNOSIS — Y9389 Activity, other specified: Secondary | ICD-10-CM | POA: Diagnosis not present

## 2019-06-25 MED ORDER — IBUPROFEN 800 MG PO TABS
800.0000 mg | ORAL_TABLET | Freq: Once | ORAL | Status: AC
Start: 2019-06-25 — End: 2019-06-25
  Administered 2019-06-25: 800 mg via ORAL
  Filled 2019-06-25: qty 1

## 2019-06-25 MED ORDER — CYCLOBENZAPRINE HCL 5 MG PO TABS: 5.0000 mg | ORAL_TABLET | Freq: Three times a day (TID) | ORAL | 0 refills | Status: AC | PRN

## 2019-06-25 MED ORDER — CYCLOBENZAPRINE HCL 10 MG PO TABS
5.0000 mg | ORAL_TABLET | Freq: Once | ORAL | Status: AC
  Administered 2019-06-25: 5 mg via ORAL
  Filled 2019-06-25: qty 1

## 2019-06-25 NOTE — ED Triage Notes (Signed)
FIRST NURSE NOTE: Pt here with family reports tripping up the stairs today. C/o back pain and leg pain.

## 2019-06-25 NOTE — ED Triage Notes (Signed)
Pt states she tripped on wooden deck and fell. States R leg twisted, top of foot pain. States hit R side of back. Denies hitting head. Denies LOC. No bleeding noted. A&O, in wheelchair. Cane in hand.
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
## 2019-06-25 NOTE — Discharge Instructions (Addendum)
You were seen today for right side rib pain and right foot pain status post a fall. Your x-rays are consistent with rib fractures of the eighth and ninth ribs and a second metatarsal fracture. We have placed you in a postop shoe. I am giving you a prescription for Flexeril 5 mg every 8 hours as needed be aware this may cause some sedation. Is important that you take deep breaths to prevent pneumonia. You can continue Tylenol 650 mg every 8 hours as needed. Follow-up with orthopedics as an outpatient.

## 2019-06-25 NOTE — ED Notes (Signed)
Pt with c/o of right foot pain and right sided back pain after falling while coming down steps. No obvious deformity noted. Pt states pain with weight bearing.

## 2019-08-04 ENCOUNTER — Other Ambulatory Visit: Payer: Self-pay | Admitting: Family Medicine

## 2019-08-04 DIAGNOSIS — Z1231 Encounter for screening mammogram for malignant neoplasm of breast: Secondary | ICD-10-CM

## 2019-08-24 ENCOUNTER — Ambulatory Visit
Admission: RE | Admit: 2019-08-24 | Discharge: 2019-08-24 | Disposition: A | Discharge status: Home / Self Care | Payer: Medicare Other | Source: Ambulatory Visit | Attending: Family Medicine | Admitting: Family Medicine

## 2019-08-24 DIAGNOSIS — Z1231 Encounter for screening mammogram for malignant neoplasm of breast: Secondary | ICD-10-CM | POA: Insufficient documentation

## 2020-02-08 ENCOUNTER — Telehealth: Payer: Self-pay | Admitting: *Deleted

## 2020-02-08 NOTE — Telephone Encounter (Signed)
Received referral for low dose lung cancer screening CT scan. Message left at phone number listed in EMR for patient to call me back to facilitate scheduling scan.  

## 2020-06-04 ENCOUNTER — Encounter: Payer: Self-pay | Admitting: *Deleted

## 2020-06-04 ENCOUNTER — Telehealth: Payer: Self-pay | Admitting: *Deleted

## 2020-06-04 NOTE — Telephone Encounter (Signed)
Received referral for low dose lung cancer screening CT scan. Message left at phone number listed in EMR for patient to call me back to facilitate scheduling scan.  

## 2020-06-08 ENCOUNTER — Telehealth: Payer: Self-pay | Admitting: *Deleted

## 2020-06-08 ENCOUNTER — Encounter: Payer: Self-pay | Admitting: *Deleted

## 2020-06-08 NOTE — Telephone Encounter (Signed)
Attempted to contact patient regarding scheduling lung screening ct scan ordered by Dr. Burnadette Pop. Unable to leave VM message. Will send letter via mail.

## 2020-06-18 ENCOUNTER — Telehealth: Payer: Self-pay | Admitting: *Deleted

## 2020-06-18 ENCOUNTER — Encounter: Payer: Self-pay | Admitting: *Deleted

## 2020-06-18 DIAGNOSIS — F17200 Nicotine dependence, unspecified, uncomplicated: Secondary | ICD-10-CM

## 2020-06-18 DIAGNOSIS — Z122 Encounter for screening for malignant neoplasm of respiratory organs: Secondary | ICD-10-CM

## 2020-06-18 DIAGNOSIS — Z87891 Personal history of nicotine dependence: Secondary | ICD-10-CM

## 2020-06-18 NOTE — Telephone Encounter (Signed)
Updated patient's cell phone number to her current [phone].

## 2020-06-18 NOTE — Telephone Encounter (Signed)
Patient returned my call re: scheduling her initial lung cancer screening scan. Obtained smoking history,(current smoker, 0.75 ppd x 32 yrs) as well as answering questions related to screening process. Patient denies signs of lung cancer such as weight loss or hemoptysis. Patient denies comorbidity that would prevent curative treatment if lung cancer were found. Patient is scheduled for shared decision making visit on 06/22/20 @ 9:00am and CT scan on 06/25/20 @ 10:00 am .

## 2020-06-18 NOTE — Telephone Encounter (Signed)
Per patient request, tried to call her back to set up lung screening scan. Left voicemail message for her to call me back.

## 2020-06-22 ENCOUNTER — Inpatient Hospital Stay: Payer: Medicare Other | Attending: Hospice and Palliative Medicine | Admitting: Nurse Practitioner

## 2020-06-22 ENCOUNTER — Other Ambulatory Visit: Payer: Self-pay

## 2020-06-22 DIAGNOSIS — Z87891 Personal history of nicotine dependence: Secondary | ICD-10-CM

## 2020-06-22 DIAGNOSIS — F17210 Nicotine dependence, cigarettes, uncomplicated: Secondary | ICD-10-CM | POA: Diagnosis not present

## 2020-06-22 NOTE — Progress Notes (Signed)
Virtual Visit via Telephone Enabled Telemedicine Note   I connected with Brittney Tucker on 06/22/20 at 9:00 AM EST by telephone enabled telemedicine visit and verified that I am speaking with the correct person using two identifiers.   I discussed the limitations, risks, security and privacy concerns of performing an evaluation and management service by telemedicine and the availability of in-person appointments. I also discussed with the patient that there may be a patient responsible charge related to this service. The patient expressed understanding and agreed to proceed.   Other persons participating in the visit and their role in the encounter: Burgess Estelle, RN- checking in patient & navigation  Patient's location: home  Provider's location: home  Chief Complaint: Low Dose CT Screening  Patient agreed to evaluation by telemedicine to discuss shared decision making for consideration of low dose CT lung cancer screening.    In accordance with CMS guidelines, patient has met eligibility criteria including age, absence of signs or symptoms of lung cancer.  Social History   Tobacco Use  . Smoking status: Current Every Day Smoker    Packs/day: 0.75    Years: 32.00    Pack years: 24.00    Types: Cigarettes  . Smokeless tobacco: Never Used  Substance Use Topics  . Alcohol use: Not Currently    Comment: drinks rarely     A shared decision-making session was conducted prior to the performance of CT scan. This includes one or more decision aids, includes benefits and harms of screening, follow-up diagnostic testing, over-diagnosis, false positive rate, and total radiation exposure.   Counseling on the importance of adherence to annual lung cancer LDCT screening, impact of co-morbidities, and ability or willingness to undergo diagnosis and treatment is imperative for compliance of the program.   Counseling on the importance of continued smoking cessation for former smokers; the  importance of smoking cessation for current smokers, and information about tobacco cessation interventions have been given to patient including [place] and [employer] [place] programs.   Written order for lung cancer screening with LDCT has been given to the patient and any and all questions have been answered to the best of my abilities.    Yearly follow up will be coordinated by Burgess Estelle, Thoracic Navigator.  I discussed the assessment and treatment plan with the patient. The patient was provided an opportunity to ask questions and all were answered. The patient agreed with the plan and demonstrated an understanding of the instructions.   The patient was advised to call back or seek an in-person evaluation if the symptoms worsen or if the condition fails to improve as anticipated.   I provided 15 minutes of non face to face telephone visit time dedicated to the care of this patient on the date of this encounter to include pre-visit review of smoking history, time with the patient, and ordering of testing/documentation.   Beckey Rutter, DNP, AGNP-C [place] [facility] [phone] (clinic)

## 2020-06-25 ENCOUNTER — Ambulatory Visit
Admission: RE | Admit: 2020-06-25 | Discharge: 2020-06-25 | Disposition: A | Discharge status: Home / Self Care | Payer: Medicare Other | Source: Ambulatory Visit | Attending: Nurse Practitioner | Admitting: Nurse Practitioner

## 2020-06-25 ENCOUNTER — Other Ambulatory Visit: Payer: Self-pay

## 2020-06-25 DIAGNOSIS — F17200 Nicotine dependence, unspecified, uncomplicated: Secondary | ICD-10-CM | POA: Diagnosis present

## 2020-06-25 DIAGNOSIS — Z87891 Personal history of nicotine dependence: Secondary | ICD-10-CM

## 2020-06-25 DIAGNOSIS — Z122 Encounter for screening for malignant neoplasm of respiratory organs: Secondary | ICD-10-CM | POA: Insufficient documentation

## 2020-06-27 ENCOUNTER — Telehealth: Payer: Self-pay | Admitting: *Deleted

## 2020-06-27 NOTE — Telephone Encounter (Signed)

## 2021-07-26 ENCOUNTER — Telehealth: Payer: Self-pay | Admitting: *Deleted

## 2021-07-26 NOTE — Telephone Encounter (Signed)
LMTC to schedule Yearly Lung CA CT Scan. 

## 2021-07-29 ENCOUNTER — Other Ambulatory Visit: Payer: Self-pay | Admitting: *Deleted

## 2021-07-29 DIAGNOSIS — Z122 Encounter for screening for malignant neoplasm of respiratory organs: Secondary | ICD-10-CM

## 2021-07-29 DIAGNOSIS — F17210 Nicotine dependence, cigarettes, uncomplicated: Secondary | ICD-10-CM

## 2021-07-29 DIAGNOSIS — Z87891 Personal history of nicotine dependence: Secondary | ICD-10-CM

## 2021-08-01 ENCOUNTER — Ambulatory Visit
Admission: RE | Admit: 2021-08-01 | Discharge: 2021-08-01 | Disposition: A | Discharge status: Home / Self Care | Payer: Medicare Other | Source: Ambulatory Visit | Attending: Acute Care | Admitting: Acute Care

## 2021-08-01 DIAGNOSIS — Z122 Encounter for screening for malignant neoplasm of respiratory organs: Secondary | ICD-10-CM | POA: Insufficient documentation

## 2021-08-01 DIAGNOSIS — F17210 Nicotine dependence, cigarettes, uncomplicated: Secondary | ICD-10-CM | POA: Insufficient documentation

## 2021-08-01 DIAGNOSIS — Z87891 Personal history of nicotine dependence: Secondary | ICD-10-CM | POA: Insufficient documentation

## 2021-08-05 ENCOUNTER — Other Ambulatory Visit: Payer: Self-pay | Admitting: Acute Care

## 2021-08-05 DIAGNOSIS — Z87891 Personal history of nicotine dependence: Secondary | ICD-10-CM

## 2021-08-05 DIAGNOSIS — Z122 Encounter for screening for malignant neoplasm of respiratory organs: Secondary | ICD-10-CM

## 2022-07-17 ENCOUNTER — Other Ambulatory Visit: Payer: Self-pay | Admitting: Family Medicine

## 2022-07-17 DIAGNOSIS — Z1231 Encounter for screening mammogram for malignant neoplasm of breast: Secondary | ICD-10-CM

## 2022-08-04 ENCOUNTER — Ambulatory Visit
Admission: RE | Admit: 2022-08-04 | Discharge: 2022-08-04 | Disposition: A | Discharge status: Home / Self Care | Payer: Medicare Other | Source: Ambulatory Visit | Attending: Acute Care | Admitting: Acute Care

## 2022-08-04 ENCOUNTER — Ambulatory Visit
Admission: RE | Admit: 2022-08-04 | Discharge: 2022-08-04 | Disposition: A | Discharge status: Home / Self Care | Payer: Medicare Other | Source: Ambulatory Visit | Attending: Family Medicine | Admitting: Family Medicine

## 2022-08-04 DIAGNOSIS — Z1231 Encounter for screening mammogram for malignant neoplasm of breast: Secondary | ICD-10-CM | POA: Insufficient documentation

## 2022-08-04 DIAGNOSIS — Z122 Encounter for screening for malignant neoplasm of respiratory organs: Secondary | ICD-10-CM | POA: Insufficient documentation

## 2022-08-04 DIAGNOSIS — I700 Atherosclerosis of aorta: Secondary | ICD-10-CM | POA: Diagnosis not present

## 2022-08-04 DIAGNOSIS — Z87891 Personal history of nicotine dependence: Secondary | ICD-10-CM | POA: Diagnosis present

## 2022-08-08 ENCOUNTER — Other Ambulatory Visit: Payer: Self-pay | Admitting: Family Medicine

## 2022-08-08 DIAGNOSIS — R928 Other abnormal and inconclusive findings on diagnostic imaging of breast: Secondary | ICD-10-CM

## 2022-08-11 ENCOUNTER — Other Ambulatory Visit: Payer: Self-pay

## 2022-08-11 ENCOUNTER — Ambulatory Visit
Admission: RE | Admit: 2022-08-11 | Discharge: 2022-08-11 | Disposition: A | Discharge status: Home / Self Care | Payer: Medicare Other | Source: Ambulatory Visit | Attending: Family Medicine | Admitting: Family Medicine

## 2022-08-11 DIAGNOSIS — Z87891 Personal history of nicotine dependence: Secondary | ICD-10-CM

## 2022-08-11 DIAGNOSIS — R928 Other abnormal and inconclusive findings on diagnostic imaging of breast: Secondary | ICD-10-CM | POA: Diagnosis present

## 2022-08-11 DIAGNOSIS — Z122 Encounter for screening for malignant neoplasm of respiratory organs: Secondary | ICD-10-CM

## 2022-08-15 IMAGING — CT CT CHEST LUNG CANCER SCREENING LOW DOSE W/O CM
2 of 5 series · 15 of 40 positions shown, 18 images · non-contrast
Comparison: 06/25/2020

CLINICAL DATA: 65-year-old female with 42 pack-year history of
smoking. Lung cancer screening.



[Series 3: lung 1.00 · axial · 0.61mm/px · z∈[-1158,-885]mm · 12 of 301 slices shown, 15 images]
[im 14/301  mediastinal]
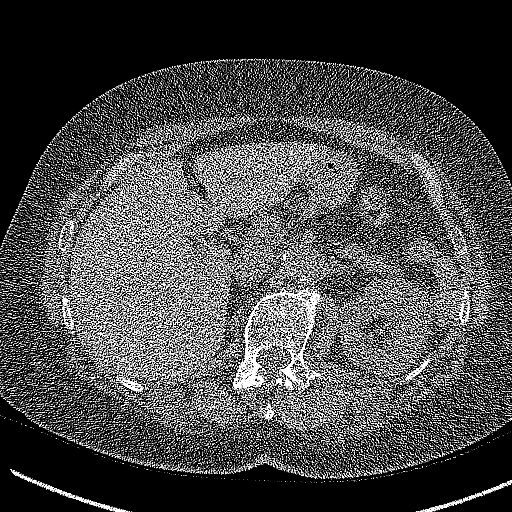
[im 14/301  lung]
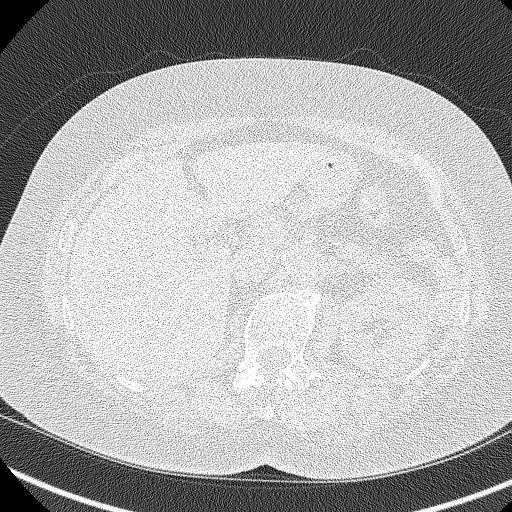
[im 41/301  lung]
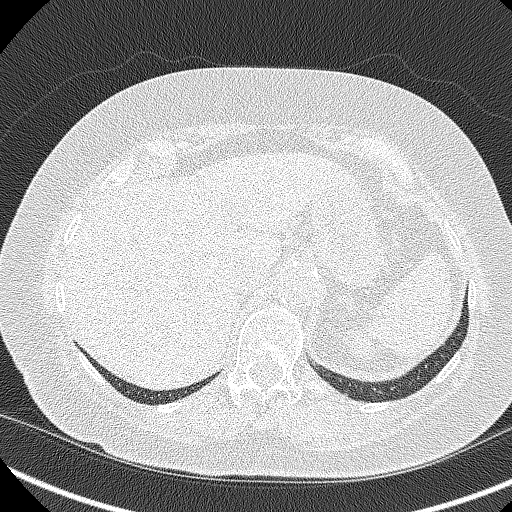
[im 69/301  lung]
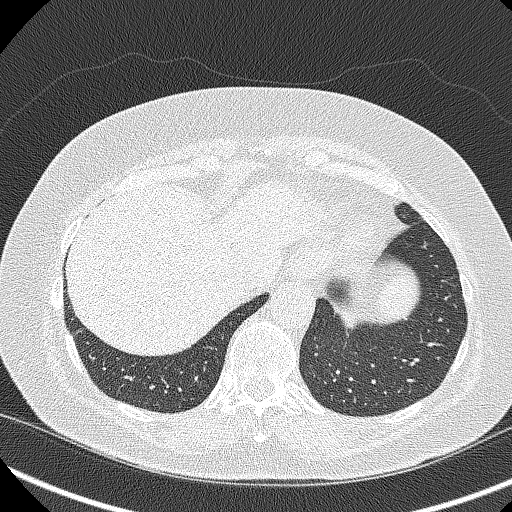
[im 96/301  lung]
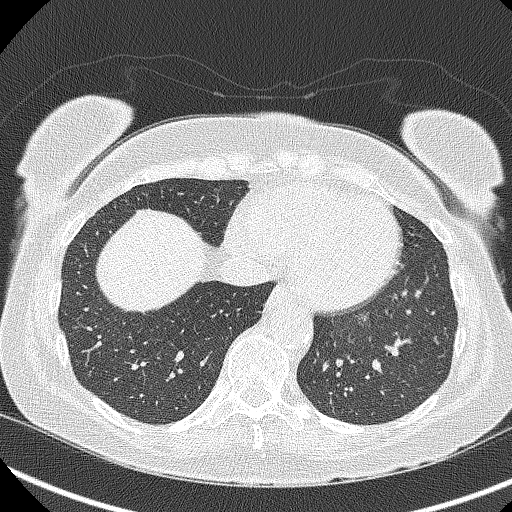
[im 110/301  mediastinal]
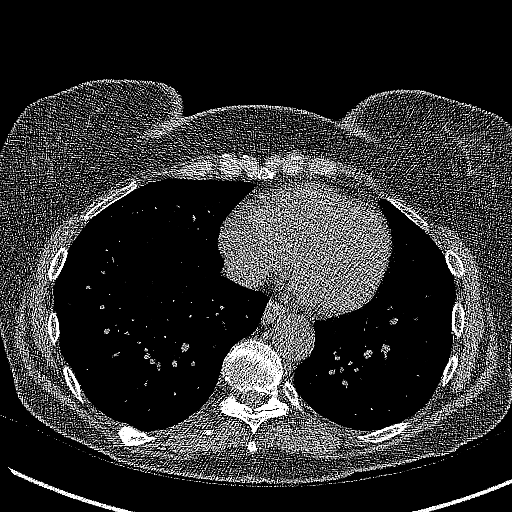
[im 110/301  lung]
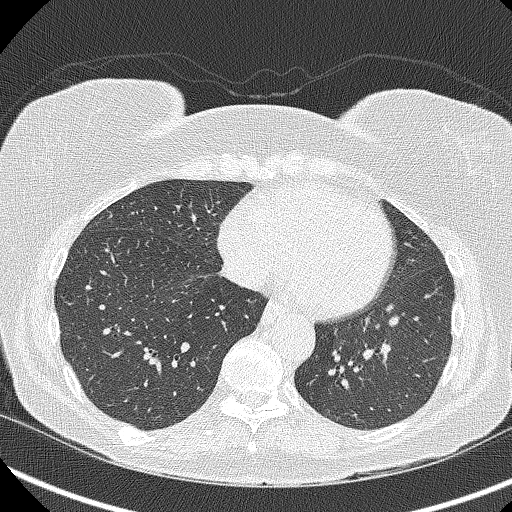
[im 137/301  lung]
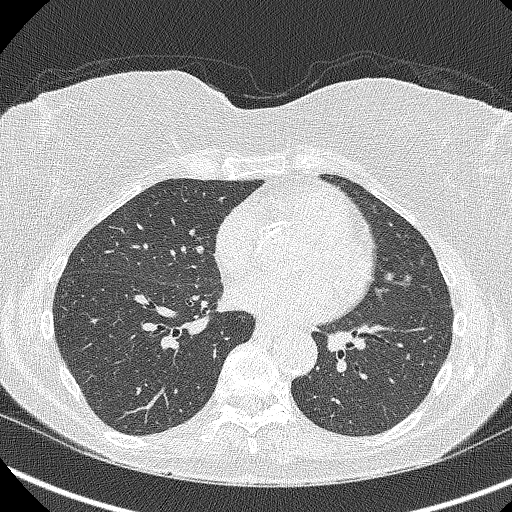
[im 164/301  lung]
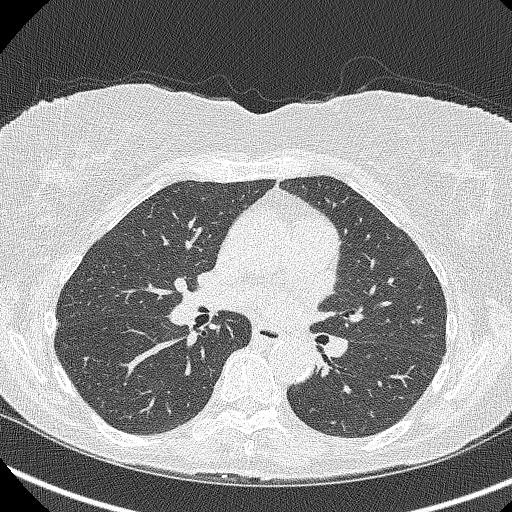
[im 191/301  lung]
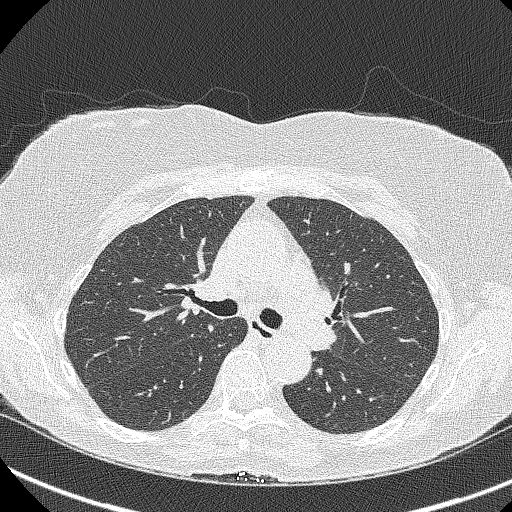
[im 205/301  mediastinal]
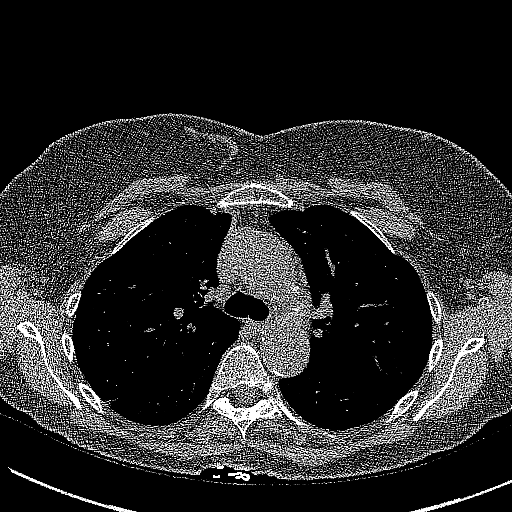
[im 205/301  lung]
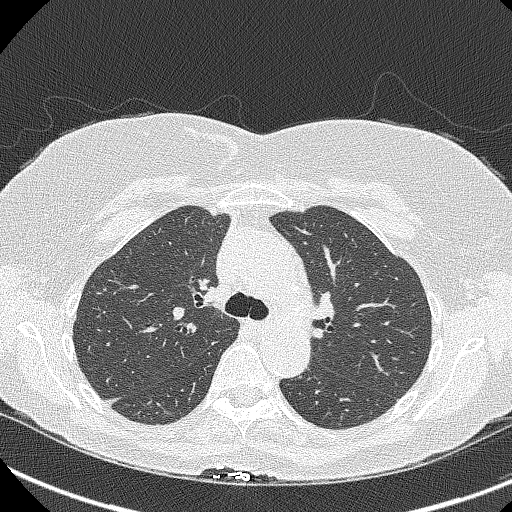
[im 232/301  lung]
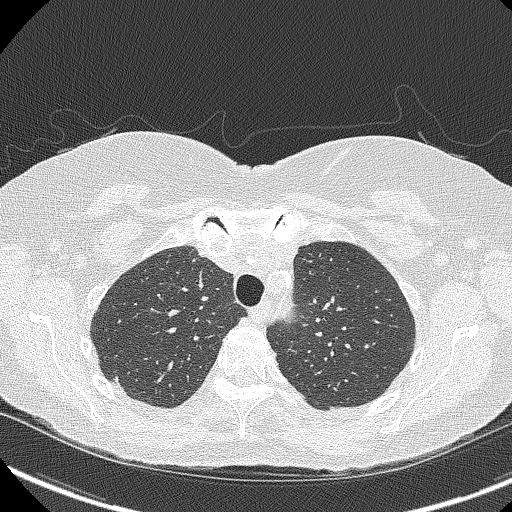
[im 260/301  lung]
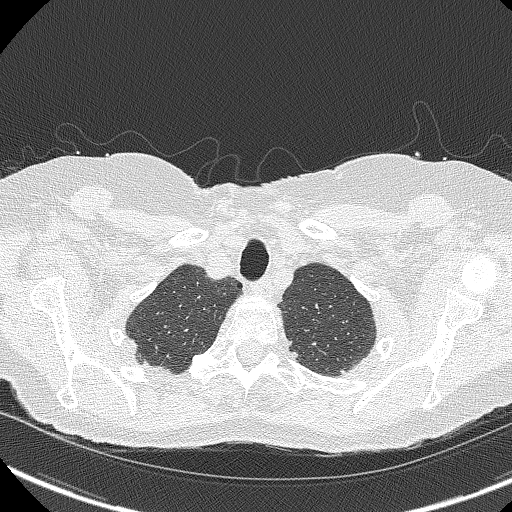
[im 287/301  lung]
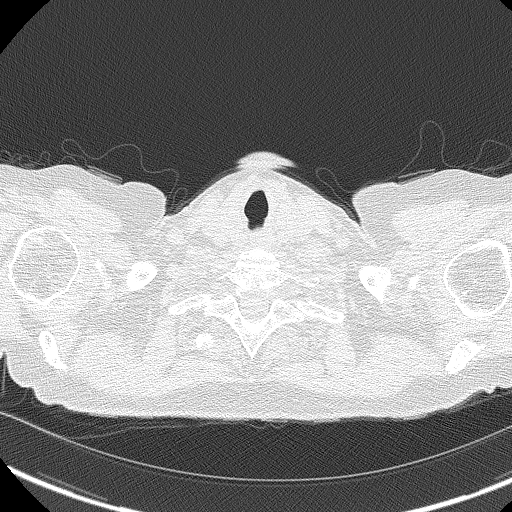

[Series 5: coronals lung 1.00 cor · coronal · 0.59mm/px · 3 of 271 slices shown]
[im 55/271  lung]
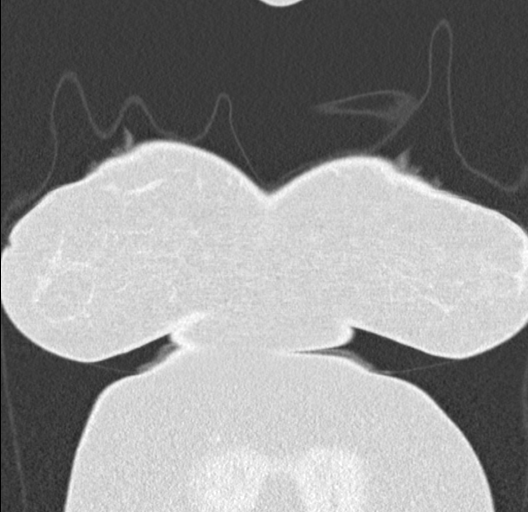
[im 109/271  lung]
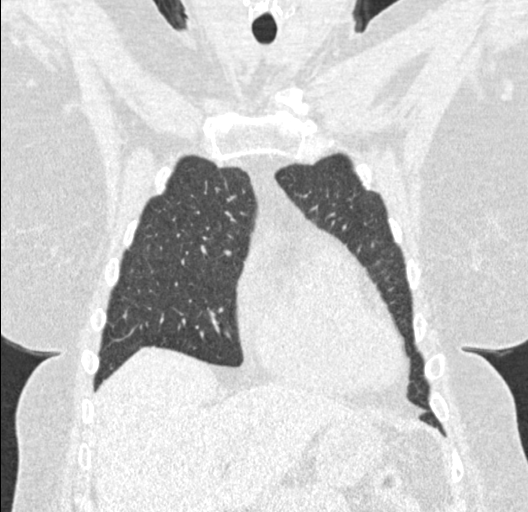
[im 163/271  lung]
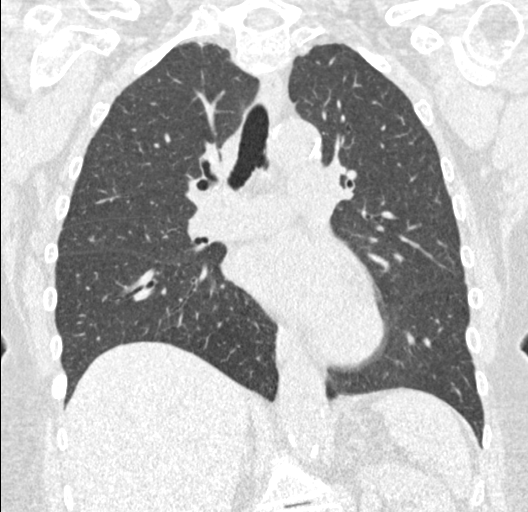

[15 of 40 positions shown; findings below may reference images not displayed]

FINDINGS: Cardiovascular: The heart size is normal. No substantial pericardial
effusion. Mild atherosclerotic calcification is noted in the wall of
the thoracic aorta.

Mediastinum/Nodes: No mediastinal lymphadenopathy. No evidence for
gross hilar lymphadenopathy although assessment is limited by the
lack of intravenous contrast on the current study. The esophagus has
normal imaging features. There is no axillary lymphadenopathy.

Lungs/Pleura: Centrilobular emphsyema noted. Biapical
pleuroparenchymal scarring evident. Previously identified scattered
pulmonary nodules are stable in the interval. No new suspicious
pulmonary nodule or mass. No focal airspace consolidation. No
pleural effusion.

Upper Abdomen: Unremarkable.

Musculoskeletal: No worrisome lytic or sclerotic osseous
abnormality.
IMPRESSION: 1. Lung-RADS 2, benign appearance or behavior. Continue annual
screening with low-dose chest CT without contrast in 12 months.
2. Aortic Atherosclerosis (I85YT-M20.0) and Emphysema (I85YT-X3B.Y).

## 2022-10-31 DIAGNOSIS — E785 Hyperlipidemia, unspecified: Secondary | ICD-10-CM | POA: Diagnosis not present

## 2022-10-31 DIAGNOSIS — J439 Emphysema, unspecified: Secondary | ICD-10-CM | POA: Diagnosis not present

## 2022-10-31 DIAGNOSIS — R7303 Prediabetes: Secondary | ICD-10-CM | POA: Diagnosis not present

## 2022-10-31 DIAGNOSIS — Z6832 Body mass index (BMI) 32.0-32.9, adult: Secondary | ICD-10-CM | POA: Diagnosis not present

## 2022-10-31 DIAGNOSIS — I700 Atherosclerosis of aorta: Secondary | ICD-10-CM | POA: Diagnosis not present

## 2022-10-31 DIAGNOSIS — E6609 Other obesity due to excess calories: Secondary | ICD-10-CM | POA: Diagnosis not present

## 2023-08-21 ENCOUNTER — Other Ambulatory Visit: Payer: Self-pay | Admitting: Acute Care

## 2023-08-21 DIAGNOSIS — Z122 Encounter for screening for malignant neoplasm of respiratory organs: Secondary | ICD-10-CM

## 2023-08-21 DIAGNOSIS — Z87891 Personal history of nicotine dependence: Secondary | ICD-10-CM

## 2023-09-02 ENCOUNTER — Ambulatory Visit

## 2023-09-02 DIAGNOSIS — Z800 Family history of malignant neoplasm of digestive organs: Secondary | ICD-10-CM | POA: Diagnosis not present

## 2023-09-02 DIAGNOSIS — K5730 Diverticulosis of large intestine without perforation or abscess without bleeding: Secondary | ICD-10-CM | POA: Diagnosis not present

## 2023-09-02 DIAGNOSIS — Z1211 Encounter for screening for malignant neoplasm of colon: Secondary | ICD-10-CM | POA: Diagnosis present

## 2023-09-04 ENCOUNTER — Ambulatory Visit

## 2023-09-11 ENCOUNTER — Ambulatory Visit
Admission: RE | Admit: 2023-09-11 | Discharge: 2023-09-11 | Disposition: A | Discharge status: Home / Self Care | Source: Ambulatory Visit | Attending: Acute Care | Admitting: Acute Care

## 2023-09-11 DIAGNOSIS — Z87891 Personal history of nicotine dependence: Secondary | ICD-10-CM | POA: Diagnosis present

## 2023-09-11 DIAGNOSIS — Z122 Encounter for screening for malignant neoplasm of respiratory organs: Secondary | ICD-10-CM | POA: Insufficient documentation

## 2023-09-21 ENCOUNTER — Other Ambulatory Visit: Payer: Self-pay

## 2023-09-21 DIAGNOSIS — Z87891 Personal history of nicotine dependence: Secondary | ICD-10-CM

## 2023-09-21 DIAGNOSIS — Z122 Encounter for screening for malignant neoplasm of respiratory organs: Secondary | ICD-10-CM
